# Patient Record
Sex: Male | Born: 2005 | Race: White | Hispanic: No | Marital: Single | Smoking: Never smoker
Health system: Southern US, Community
[De-identification: ages and names within clinical notes are randomized; demographics above are authoritative.]

## PROBLEM LIST (undated history)

## (undated) DIAGNOSIS — L309 Dermatitis, unspecified: Secondary | ICD-10-CM

---
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
## 2023-03-07 DIAGNOSIS — I4711 Inappropriate sinus tachycardia, so stated: Secondary | ICD-10-CM | POA: Diagnosis not present
